# Patient Record
Sex: Male | Born: 1995 | Race: White | Hispanic: No | Marital: Single | Smoking: Never smoker
Health system: Southern US, Community
[De-identification: ages and names within clinical notes are randomized; demographics above are authoritative.]

## PROBLEM LIST (undated history)

## (undated) DIAGNOSIS — R1110 Vomiting, unspecified: Secondary | ICD-10-CM

## (undated) HISTORY — DX: Vomiting, unspecified: R11.10

---
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
## 2016-05-02 ENCOUNTER — Emergency Department
Admission: EM | Admit: 2016-05-02 | Discharge: 2016-05-02 | Disposition: A | Discharge status: Home / Self Care | Payer: Medicaid Other | Attending: Emergency Medicine | Admitting: Emergency Medicine

## 2016-05-02 ENCOUNTER — Emergency Department: Payer: Medicaid Other

## 2016-05-02 ENCOUNTER — Encounter: Payer: Self-pay | Admitting: Emergency Medicine

## 2016-05-02 DIAGNOSIS — M7522 Bicipital tendinitis, left shoulder: Secondary | ICD-10-CM

## 2016-05-02 DIAGNOSIS — M25512 Pain in left shoulder: Secondary | ICD-10-CM | POA: Diagnosis present

## 2016-05-02 DIAGNOSIS — Y9389 Activity, other specified: Secondary | ICD-10-CM | POA: Insufficient documentation

## 2016-05-02 DIAGNOSIS — W098XXA Fall on or from other playground equipment, initial encounter: Secondary | ICD-10-CM | POA: Diagnosis not present

## 2016-05-02 DIAGNOSIS — Y999 Unspecified external cause status: Secondary | ICD-10-CM | POA: Diagnosis not present

## 2016-05-02 DIAGNOSIS — Y929 Unspecified place or not applicable: Secondary | ICD-10-CM | POA: Diagnosis not present

## 2016-05-02 MED ORDER — NAPROXEN 500 MG PO TABS
500.0000 mg | ORAL_TABLET | Freq: Once | ORAL | Status: AC
  Administered 2016-05-02: 500 mg via ORAL
  Filled 2016-05-02: qty 1

## 2016-05-02 MED ORDER — NAPROXEN 500 MG PO TABS: 500.0000 mg | ORAL_TABLET | Freq: Two times a day (BID) | ORAL | 0 refills | Status: AC

## 2016-05-02 NOTE — ED Provider Notes (Signed)
[facility] Emergency Department Provider Note   ____________________________________________   None    (approximate)  I have reviewed the triage vital signs and the nursing notes.   HISTORY  Chief Complaint Shoulder Pain    HPI Todd SalinesJoshua L Dorrance is a 20 y.o. male patient plana right humeral pain slipped and fell, multiple. Patient stated prior to the fall while swimming and he felt a "popping" sensation in his upper arm. Patient states he has full extension with pain in the bicep area and full  flexion patient rates his pain as a 7/10. Incident occurred approximately an hour and a half ago. No palliative measures for this complaint. Past Medical History:  Diagnosis Date  . Vomiting     Patient Active Problem List   Diagnosis Date Noted  . Morning vomiting     History reviewed. No pertinent surgical history.  Prior to Admission medications   Medication Sig Start Date End Date Taking? Authorizing Provider  Melatonin 1 MG TABS Take by mouth.    Historical Provider, MD  naproxen (NAPROSYN) 500 MG tablet Take 1 tablet (500 mg total) by mouth 2 (two) times daily with a meal. 05/02/16   Joni Reiningonald K Smith, PA-C    Allergies Review of patient's allergies indicates no known allergies.  Family History  Problem Relation Age of Onset  . Celiac disease Neg Hx   . Cholelithiasis Neg Hx   . Ulcers Neg Hx     Social History Social History  Substance Use Topics  . Smoking status: Never Smoker  . Smokeless tobacco: Never Used  . Alcohol use No    Review of Systems Constitutional: No fever/chills Eyes: No visual changes. ENT: No sore throat. Cardiovascular: Denies chest pain. Respiratory: Denies shortness of breath. Gastrointestinal: No abdominal pain.  No nausea, no vomiting.  No diarrhea.  No constipation. Genitourinary: Negative for dysuria. Musculoskeletal: Left upper arm pain. Skin: Negative for rash. Neurological: Negative for headaches, focal  weakness or numbness.    ____________________________________________   PHYSICAL EXAM:  VITAL SIGNS: ED Triage Vitals  Enc Vitals Group     BP 05/02/16 1628 (!) 158/78     Pulse Rate 05/02/16 1627 92     Resp 05/02/16 1627 20     Temp 05/02/16 1627 98.2 F (36.8 C)     Temp Source 05/02/16 1627 Oral     SpO2 05/02/16 1627 100 %     Weight 05/02/16 1627 160 lb (72.6 kg)     Height 05/02/16 1627 6' (1.829 m)     Head Circumference --      Peak Flow --      Pain Score 05/02/16 1627 7     Pain Loc --      Pain Edu? --      Excl. in GC? --     Constitutional: Alert and oriented. Well appearing and in no acute distress. Eyes: Conjunctivae are normal. PERRL. EOMI. Head: Atraumatic. Nose: No congestion/rhinnorhea. Mouth/Throat: Mucous membranes are moist.  Oropharynx non-erythematous. Neck: No stridor.  No cervical spine tenderness to palpation. Hematological/Lymphatic/Immunilogical: No cervical lymphadenopathy. Cardiovascular: Normal rate, regular rhythm. Grossly normal heart sounds.  Good peripheral circulation. Respiratory: Normal respiratory effort.  No retractions. Lungs CTAB. Gastrointestinal: Soft and nontender. No distention. No abdominal bruits. No CVA tenderness. Musculoskeletal:No obvious deformity to the left upper extremity. Patient has full nuchal range of motion's. Patient is moderate guarding palpation at the insertion point of the tricep.  Neurologic:  Normal speech and language.  No gross focal neurologic deficits are appreciated. No gait instability. Skin:  Skin is warm, dry and intact. No rash noted. Psychiatric: Mood and affect are normal. Speech and behavior are normal.  ____________________________________________   LABS (all labs ordered are listed, but only abnormal results are displayed)  Labs Reviewed - No data to display ____________________________________________  EKG   ____________________________________________  RADIOLOGY  No acute  findings x-ray of the left upper arm. ____________________________________________   PROCEDURES  Procedure(s) performed: None  Procedures  Critical Care performed: No  ____________________________________________   INITIAL IMPRESSION / ASSESSMENT AND PLAN / ED COURSE  Pertinent labs & imaging results that were available during my care of the patient were reviewed by me and considered in my medical decision making (see chart for details).  Bicep tendinitis. Discussed X-ray finding with patient. Patient given discharge Instructions. Patient prescription for naproxen. Patient advised follow-up family doctor condition persists.  Clinical Course     ____________________________________________   FINAL CLINICAL IMPRESSION(S) / ED DIAGNOSES  Final diagnoses:  Biceps tendinitis on left      NEW MEDICATIONS STARTED DURING THIS VISIT:  New Prescriptions   NAPROXEN (NAPROSYN) 500 MG TABLET    Take 1 tablet (500 mg total) by mouth 2 (two) times daily with a meal.     Note:  This document was prepared using Dragon voice recognition software and may include unintentional dictation errors.    Joni Reiningonald K Smith, PA-C 05/02/16 1739    Joni Reiningonald K Smith, PA-C 05/02/16 1740    Jeanmarie PlantJames A McShane, MD 05/02/16 2021

## 2016-05-02 NOTE — ED Triage Notes (Signed)
Pt states he slipped and fell from the monkey bars about an hour ago and felt something pop . Pt has full mobility of the shoulder/arm..Marland Kitchen

## 2016-05-02 NOTE — ED Notes (Signed)
States he felt a pop in left upper arm while on the monkey bars   "felt like my arm was dislocated"  Then fell  unsure how he landed  But conts to have pain to left upper arm

## 2017-02-28 IMAGING — CR DG HUMERUS 2V *L*
2 series · 2 of 2 positions shown · non-contrast
Comparison: None.

CLINICAL DATA: Left arm popping sensations when swinging from a
monkey bar.

EXAM:
LEFT HUMERUS - 2+ VIEW

[humerus ap]
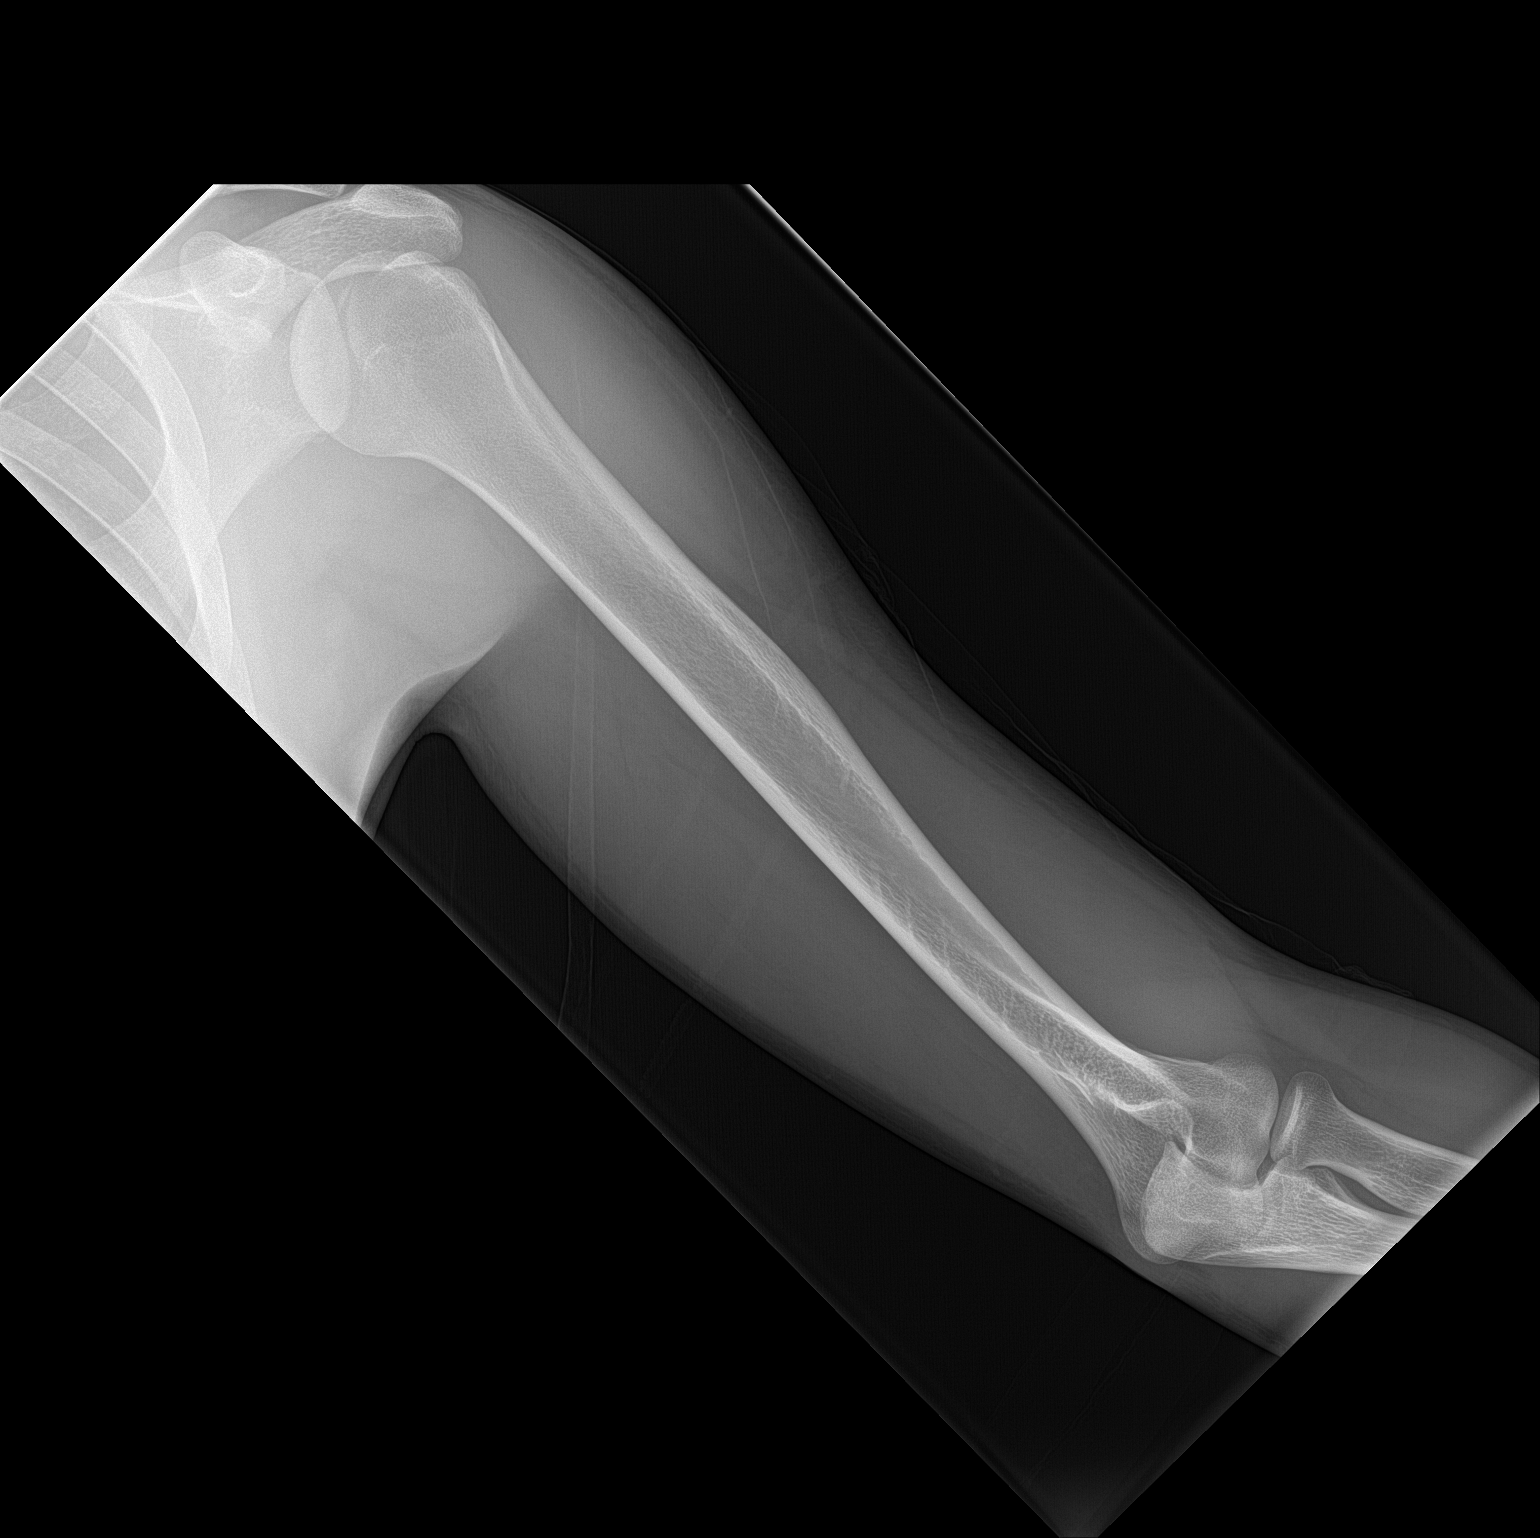

[humerus lat]
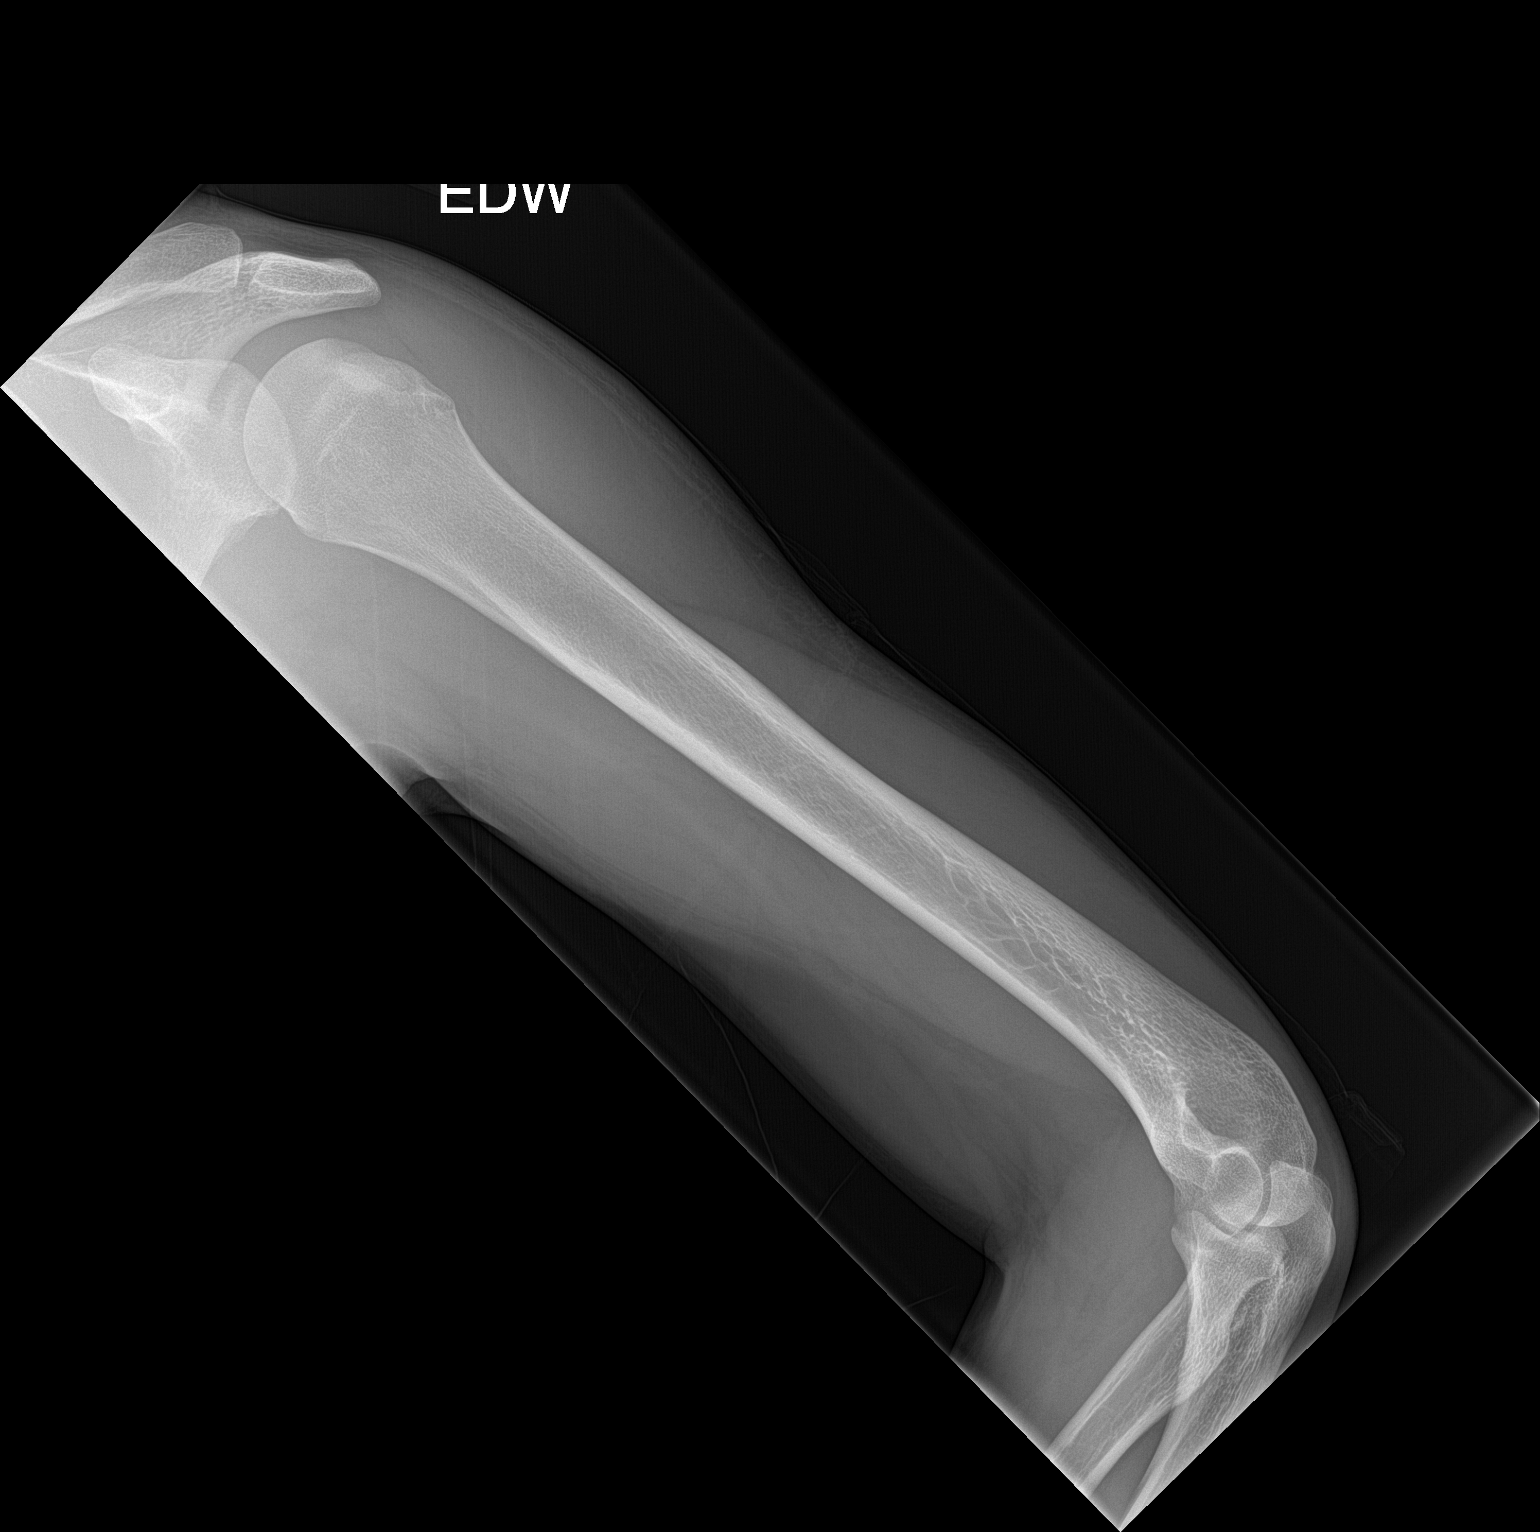

[2 of 2 positions shown; findings below may reference images not displayed]

FINDINGS: There is no evidence of fracture or other focal bone lesions. Soft
tissues are unremarkable.
IMPRESSION: Normal examination.

## 2019-08-28 ENCOUNTER — Ambulatory Visit: Payer: Medicaid Other | Attending: Internal Medicine

## 2019-08-28 ENCOUNTER — Other Ambulatory Visit: Payer: Self-pay

## 2019-08-28 DIAGNOSIS — Z20822 Contact with and (suspected) exposure to covid-19: Secondary | ICD-10-CM

## 2019-08-29 NOTE — Progress Notes (Signed)
Order(s) created erroneously. Erroneous order ID: [id number]  Order moved by: Ertha Nabor M  Order move date/time: 08/29/2019 6:55 PM  Source Patient: [id number]  Source Contact: 08/28/2019  Destination Patient: [id number]  Destination Contact: 08/28/2019 

## 2019-08-29 NOTE — Progress Notes (Signed)
Order(s) created erroneously. Erroneous order ID: [id number]  Order moved by: Brigitte Pulse  Order move date/time: 08/29/2019 6:55 PM  Source Patient: [id number]  Source Contact: 08/28/2019  Destination Patient: [id number]  Destination Contact: 08/28/2019

## 2019-08-29 NOTE — Progress Notes (Signed)
Moving orders to this encounter.  

## 2019-08-30 LAB — NOVEL CORONAVIRUS, NAA: SARS-CoV-2, NAA: NOT DETECTED

## 2022-08-12 DIAGNOSIS — Z419 Encounter for procedure for purposes other than remedying health state, unspecified: Secondary | ICD-10-CM | POA: Diagnosis not present

## 2022-09-12 DIAGNOSIS — Z419 Encounter for procedure for purposes other than remedying health state, unspecified: Secondary | ICD-10-CM | POA: Diagnosis not present

## 2022-09-21 ENCOUNTER — Telehealth: Payer: Self-pay

## 2022-09-21 NOTE — Telephone Encounter (Signed)
Mychart msg sent. AS, CMA 

## 2022-10-13 DIAGNOSIS — Z419 Encounter for procedure for purposes other than remedying health state, unspecified: Secondary | ICD-10-CM | POA: Diagnosis not present

## 2022-11-11 DIAGNOSIS — Z419 Encounter for procedure for purposes other than remedying health state, unspecified: Secondary | ICD-10-CM | POA: Diagnosis not present

## 2022-12-12 DIAGNOSIS — Z419 Encounter for procedure for purposes other than remedying health state, unspecified: Secondary | ICD-10-CM | POA: Diagnosis not present

## 2023-01-11 DIAGNOSIS — Z419 Encounter for procedure for purposes other than remedying health state, unspecified: Secondary | ICD-10-CM | POA: Diagnosis not present

## 2023-02-11 DIAGNOSIS — Z419 Encounter for procedure for purposes other than remedying health state, unspecified: Secondary | ICD-10-CM | POA: Diagnosis not present

## 2023-03-13 DIAGNOSIS — Z419 Encounter for procedure for purposes other than remedying health state, unspecified: Secondary | ICD-10-CM | POA: Diagnosis not present

## 2023-04-13 DIAGNOSIS — Z419 Encounter for procedure for purposes other than remedying health state, unspecified: Secondary | ICD-10-CM | POA: Diagnosis not present

## 2023-05-14 DIAGNOSIS — Z419 Encounter for procedure for purposes other than remedying health state, unspecified: Secondary | ICD-10-CM | POA: Diagnosis not present

## 2023-06-13 DIAGNOSIS — Z419 Encounter for procedure for purposes other than remedying health state, unspecified: Secondary | ICD-10-CM | POA: Diagnosis not present

## 2023-07-14 DIAGNOSIS — Z419 Encounter for procedure for purposes other than remedying health state, unspecified: Secondary | ICD-10-CM | POA: Diagnosis not present

## 2023-08-13 DIAGNOSIS — Z419 Encounter for procedure for purposes other than remedying health state, unspecified: Secondary | ICD-10-CM | POA: Diagnosis not present

## 2023-09-13 DIAGNOSIS — Z419 Encounter for procedure for purposes other than remedying health state, unspecified: Secondary | ICD-10-CM | POA: Diagnosis not present

## 2023-10-14 DIAGNOSIS — Z419 Encounter for procedure for purposes other than remedying health state, unspecified: Secondary | ICD-10-CM | POA: Diagnosis not present

## 2023-11-11 DIAGNOSIS — Z419 Encounter for procedure for purposes other than remedying health state, unspecified: Secondary | ICD-10-CM | POA: Diagnosis not present

## 2024-01-22 DIAGNOSIS — Z419 Encounter for procedure for purposes other than remedying health state, unspecified: Secondary | ICD-10-CM | POA: Diagnosis not present

## 2024-02-22 DIAGNOSIS — Z419 Encounter for procedure for purposes other than remedying health state, unspecified: Secondary | ICD-10-CM | POA: Diagnosis not present

## 2024-03-23 DIAGNOSIS — Z419 Encounter for procedure for purposes other than remedying health state, unspecified: Secondary | ICD-10-CM | POA: Diagnosis not present

## 2024-04-23 DIAGNOSIS — Z419 Encounter for procedure for purposes other than remedying health state, unspecified: Secondary | ICD-10-CM | POA: Diagnosis not present

## 2024-05-24 DIAGNOSIS — Z419 Encounter for procedure for purposes other than remedying health state, unspecified: Secondary | ICD-10-CM | POA: Diagnosis not present

## 2024-08-23 DIAGNOSIS — Z419 Encounter for procedure for purposes other than remedying health state, unspecified: Secondary | ICD-10-CM | POA: Diagnosis not present
